# Patient Record
Sex: Male | Born: 1945 | Race: White | Hispanic: No | Marital: Married | Smoking: Never smoker
Health system: Southern US, Community
[De-identification: ages and names within clinical notes are randomized; demographics above are authoritative.]

## PROBLEM LIST (undated history)

## (undated) DIAGNOSIS — G4730 Sleep apnea, unspecified: Secondary | ICD-10-CM

## (undated) DIAGNOSIS — N401 Enlarged prostate with lower urinary tract symptoms: Secondary | ICD-10-CM

## (undated) DIAGNOSIS — N138 Other obstructive and reflux uropathy: Secondary | ICD-10-CM

## (undated) DIAGNOSIS — T300 Burn of unspecified body region, unspecified degree: Secondary | ICD-10-CM

## (undated) DIAGNOSIS — F4310 Post-traumatic stress disorder, unspecified: Secondary | ICD-10-CM

## (undated) DIAGNOSIS — Z87442 Personal history of urinary calculi: Secondary | ICD-10-CM

## (undated) DIAGNOSIS — I10 Essential (primary) hypertension: Secondary | ICD-10-CM

## (undated) DIAGNOSIS — J45909 Unspecified asthma, uncomplicated: Secondary | ICD-10-CM

## (undated) DIAGNOSIS — F329 Major depressive disorder, single episode, unspecified: Secondary | ICD-10-CM

## (undated) DIAGNOSIS — K219 Gastro-esophageal reflux disease without esophagitis: Secondary | ICD-10-CM

## (undated) DIAGNOSIS — L639 Alopecia areata, unspecified: Secondary | ICD-10-CM

## (undated) DIAGNOSIS — K5790 Diverticulosis of intestine, part unspecified, without perforation or abscess without bleeding: Secondary | ICD-10-CM

## (undated) DIAGNOSIS — M1990 Unspecified osteoarthritis, unspecified site: Secondary | ICD-10-CM

## (undated) HISTORY — PX: COLONOSCOPY: SHX174

## (undated) HISTORY — PX: JOINT REPLACEMENT: SHX530

---

## 1970-09-09 DIAGNOSIS — T300 Burn of unspecified body region, unspecified degree: Secondary | ICD-10-CM

## 1970-09-09 HISTORY — DX: Burn of unspecified body region, unspecified degree: T30.0

## 2008-02-01 ENCOUNTER — Ambulatory Visit: Payer: Self-pay | Admitting: Family Medicine

## 2014-12-09 HISTORY — PX: TOTAL SHOULDER REPLACEMENT: SUR1217

## 2017-12-03 ENCOUNTER — Other Ambulatory Visit: Payer: Self-pay | Admitting: Surgery

## 2017-12-03 DIAGNOSIS — M7581 Other shoulder lesions, right shoulder: Secondary | ICD-10-CM

## 2017-12-05 ENCOUNTER — Other Ambulatory Visit: Payer: Self-pay | Admitting: Surgery

## 2017-12-05 DIAGNOSIS — M7581 Other shoulder lesions, right shoulder: Secondary | ICD-10-CM

## 2017-12-19 ENCOUNTER — Ambulatory Visit
Admission: RE | Admit: 2017-12-19 | Discharge: 2017-12-19 | Disposition: A | Discharge status: Home / Self Care | Payer: No Typology Code available for payment source | Source: Ambulatory Visit | Attending: Surgery | Admitting: Surgery

## 2017-12-19 DIAGNOSIS — M7581 Other shoulder lesions, right shoulder: Secondary | ICD-10-CM | POA: Diagnosis present

## 2017-12-19 DIAGNOSIS — M19011 Primary osteoarthritis, right shoulder: Secondary | ICD-10-CM | POA: Diagnosis not present

## 2017-12-19 DIAGNOSIS — M24011 Loose body in right shoulder: Secondary | ICD-10-CM | POA: Diagnosis not present

## 2018-01-12 ENCOUNTER — Other Ambulatory Visit: Payer: Self-pay

## 2018-01-12 ENCOUNTER — Encounter
Admission: RE | Admit: 2018-01-12 | Discharge: 2018-01-12 | Disposition: A | Discharge status: Home / Self Care | Payer: Non-veteran care | Source: Ambulatory Visit | Attending: Surgery | Admitting: Surgery

## 2018-01-12 DIAGNOSIS — Z01812 Encounter for preprocedural laboratory examination: Secondary | ICD-10-CM | POA: Diagnosis present

## 2018-01-12 DIAGNOSIS — R001 Bradycardia, unspecified: Secondary | ICD-10-CM | POA: Insufficient documentation

## 2018-01-12 DIAGNOSIS — Z01810 Encounter for preprocedural cardiovascular examination: Secondary | ICD-10-CM | POA: Diagnosis present

## 2018-01-12 HISTORY — DX: Unspecified osteoarthritis, unspecified site: M19.90

## 2018-01-12 HISTORY — DX: Diverticulosis of intestine, part unspecified, without perforation or abscess without bleeding: K57.90

## 2018-01-12 HISTORY — DX: Benign prostatic hyperplasia with lower urinary tract symptoms: N40.1

## 2018-01-12 HISTORY — DX: Other obstructive and reflux uropathy: N13.8

## 2018-01-12 HISTORY — DX: Essential (primary) hypertension: I10

## 2018-01-12 HISTORY — DX: Sleep apnea, unspecified: G47.30

## 2018-01-12 HISTORY — DX: Unspecified asthma, uncomplicated: J45.909

## 2018-01-12 HISTORY — DX: Alopecia areata, unspecified: L63.9

## 2018-01-12 HISTORY — DX: Personal history of urinary calculi: Z87.442

## 2018-01-12 HISTORY — DX: Major depressive disorder, single episode, unspecified: F32.9

## 2018-01-12 HISTORY — DX: Burn of unspecified body region, unspecified degree: T30.0

## 2018-01-12 HISTORY — DX: Gastro-esophageal reflux disease without esophagitis: K21.9

## 2018-01-12 HISTORY — DX: Post-traumatic stress disorder, unspecified: F43.10

## 2018-01-12 LAB — BASIC METABOLIC PANEL
Anion gap: 6 (ref 5–15)
BUN: 21 mg/dL — AB (ref 6–20)
CHLORIDE: 104 mmol/L (ref 101–111)
CO2: 29 mmol/L (ref 22–32)
Calcium: 9.1 mg/dL (ref 8.9–10.3)
Creatinine, Ser: 1.05 mg/dL (ref 0.61–1.24)
GFR calc non Af Amer: >60 mL/min (ref >60)
Glucose, Bld: 92 mg/dL (ref 65–99)
POTASSIUM: 4.2 mmol/L (ref 3.5–5.1)
SODIUM: 139 mmol/L (ref 135–145)

## 2018-01-12 LAB — CBC
HEMATOCRIT: 45.3 % (ref 40.0–52.0)
HEMOGLOBIN: 15.9 g/dL (ref 13.0–18.0)
MCH: 30.7 pg (ref 26.0–34.0)
MCHC: 35.2 g/dL (ref 32.0–36.0)
MCV: 87.1 fL (ref 80.0–100.0)
Platelets: 156 10*3/uL (ref 150–440)
RBC: 5.2 MIL/uL (ref 4.40–5.90)
RDW: 12.9 % (ref 11.5–14.5)
WBC: 7.5 10*3/uL (ref 3.8–10.6)

## 2018-01-12 LAB — SURGICAL PCR SCREEN
MRSA, PCR: NEGATIVE
STAPHYLOCOCCUS AUREUS: POSITIVE — AB

## 2018-01-12 LAB — PROTIME-INR
INR: 1.03
PROTHROMBIN TIME: 13.4 s (ref 11.4–15.2)

## 2018-01-12 LAB — URINALYSIS, ROUTINE W REFLEX MICROSCOPIC
Bilirubin Urine: NEGATIVE
Glucose, UA: NEGATIVE mg/dL
Hgb urine dipstick: NEGATIVE
KETONES UR: NEGATIVE mg/dL
LEUKOCYTES UA: NEGATIVE
NITRITE: NEGATIVE
PROTEIN: NEGATIVE mg/dL
Specific Gravity, Urine: 1.009 (ref 1.005–1.030)
pH: 6 (ref 5.0–8.0)

## 2018-01-12 LAB — TYPE AND SCREEN
ABO/RH(D): A NEG
Antibody Screen: NEGATIVE

## 2018-01-12 NOTE — Patient Instructions (Signed)
Your procedure is scheduled on: Tuesday, Jan 20, 2018 Report to Day Surgery on the 2nd floor of the [facility] To find out your arrival time, please call [phone] between 1PM - 3PM on: Monday, Jan 19, 2018  REMEMBER: Instructions that are not followed completely may result in serious medical risk, up to and including death; or upon the discretion of your surgeon and anesthesiologist your surgery may need to be rescheduled.  Do not eat food after midnight the night before your procedure.  No gum chewing, lozengers or hard candies.  You may however, drink CLEAR liquids up to 2 hours before you are scheduled to arrive for your surgery. Do not drink anything within 2 hours of the start of your surgery.  Clear liquids include: - water  - apple juice without pulp - clear gatorade - black coffee or tea (Do NOT add anything to the coffee or tea) Do NOT drink anything that is not on this list.  No Alcohol for 24 hours before or after surgery.  No Smoking including e-cigarettes for 24 hours prior to surgery.  No chewable tobacco products for at least 6 hours prior to surgery.  No nicotine patches on the day of surgery.  On the morning of surgery brush your teeth with toothpaste and water, you may rinse your mouth with mouthwash if you wish. Do not swallow any toothpaste or mouthwash.  Notify your doctor if there is any change in your medical condition (cold, fever, infection).  Do not wear jewelry, make-up, hairpins, clips or nail polish.  Do not wear lotions, powders, or perfumes.   Do not shave 48 hours prior to surgery. Men may shave face and neck.  Contacts and dentures may not be worn into surgery.  Do not bring valuables to the hospital, including drivers license, insurance or credit cards.  [facility] is not responsible for any belongings or valuables.   TAKE THESE MEDICATIONS THE MORNING OF SURGERY:  1.  ALBUTEROL INHALER 2.  AMLODIPINE 3.  BUPROPION 4.   CARVEDILOL 5.  PANTOPRAZOLE 6.  SERTRALINE 7.  OXYCODONE (if needed for pain)  Use CHG Soap as directed on instruction sheet.  Use inhalers on the day of surgery and bring to the hospital.  NOW!  Stop ASPIRIN and Anti-inflammatories (NSAIDS) such as NABUMETONE, Advil, Aleve, Ibuprofen, Motrin, Naproxen, Naprosyn and Aspirin based products such as Excedrin, Goodys Powder, BC Powder. (May take Tylenol or Acetaminophen if needed.)  NOW!  Stop ANY OVER THE COUNTER supplements until after surgery.  Wear comfortable clothing (specific to your surgery type) to the hospital.  Plan for stool softeners for home use.  If you are being admitted to the hospital overnight, leave your suitcase in the car. After surgery it may be brought to your room.  If you are being discharged the day of surgery, you will not be allowed to drive home. You will need a responsible adult to drive you home and stay with you that night.   If you are taking public transportation, you will need to have a responsible adult with you. Please confirm with your physician that it is acceptable to use public transportation.   Please call [phone] if you have any questions about these instructions.

## 2018-01-13 LAB — URINE CULTURE: Culture: <10000 — AB

## 2018-01-19 MED ORDER — VANCOMYCIN HCL IN DEXTROSE 1-5 GM/200ML-% IV SOLN
1000.0000 mg | Freq: Once | INTRAVENOUS | Status: AC
  Administered 2018-01-20: 1000 mg via INTRAVENOUS

## 2018-01-20 ENCOUNTER — Inpatient Hospital Stay: Payer: Non-veteran care

## 2018-01-20 ENCOUNTER — Inpatient Hospital Stay
Admission: RE | Admit: 2018-01-20 | Discharge: 2018-01-21 | DRG: 483 | Disposition: A | Discharge status: Home Health | Payer: Non-veteran care | Source: Ambulatory Visit | Attending: Surgery | Admitting: Surgery

## 2018-01-20 ENCOUNTER — Inpatient Hospital Stay: Payer: Non-veteran care | Admitting: Anesthesiology

## 2018-01-20 ENCOUNTER — Encounter: Payer: Self-pay | Admitting: *Deleted

## 2018-01-20 ENCOUNTER — Other Ambulatory Visit: Payer: Self-pay

## 2018-01-20 ENCOUNTER — Encounter
Admission: RE | Disposition: A | Discharge status: Home Health | Payer: Self-pay | Source: Ambulatory Visit | Attending: Surgery

## 2018-01-20 DIAGNOSIS — F4310 Post-traumatic stress disorder, unspecified: Secondary | ICD-10-CM | POA: Diagnosis present

## 2018-01-20 DIAGNOSIS — J45909 Unspecified asthma, uncomplicated: Secondary | ICD-10-CM | POA: Diagnosis present

## 2018-01-20 DIAGNOSIS — G4730 Sleep apnea, unspecified: Secondary | ICD-10-CM | POA: Diagnosis present

## 2018-01-20 DIAGNOSIS — K219 Gastro-esophageal reflux disease without esophagitis: Secondary | ICD-10-CM | POA: Diagnosis present

## 2018-01-20 DIAGNOSIS — I10 Essential (primary) hypertension: Secondary | ICD-10-CM | POA: Diagnosis present

## 2018-01-20 DIAGNOSIS — E669 Obesity, unspecified: Secondary | ICD-10-CM | POA: Diagnosis present

## 2018-01-20 DIAGNOSIS — Z6830 Body mass index (BMI) 30.0-30.9, adult: Secondary | ICD-10-CM

## 2018-01-20 DIAGNOSIS — Z96611 Presence of right artificial shoulder joint: Secondary | ICD-10-CM

## 2018-01-20 DIAGNOSIS — M19011 Primary osteoarthritis, right shoulder: Secondary | ICD-10-CM | POA: Diagnosis present

## 2018-01-20 DIAGNOSIS — Z7982 Long term (current) use of aspirin: Secondary | ICD-10-CM | POA: Diagnosis not present

## 2018-01-20 HISTORY — PX: REVERSE SHOULDER ARTHROPLASTY: SHX5054

## 2018-01-20 LAB — ABO/RH: ABO/RH(D): A NEG

## 2018-01-20 SURGERY — ARTHROPLASTY, SHOULDER, TOTAL, REVERSE
Anesthesia: General | Site: Shoulder | Laterality: Right | Wound class: Clean

## 2018-01-20 MED ORDER — HYDROMORPHONE HCL 1 MG/ML IJ SOLN: 0.5000 mg | INTRAMUSCULAR | Status: DC | PRN

## 2018-01-20 MED ORDER — TRANEXAMIC ACID 1000 MG/10ML IV SOLN
INTRAVENOUS | Status: AC
  Filled 2018-01-20: qty 10

## 2018-01-20 MED ORDER — BUPIVACAINE HCL (PF) 0.5 % IJ SOLN
INTRAMUSCULAR | Status: DC | PRN
  Administered 2018-01-20: 10 mL via PERINEURAL

## 2018-01-20 MED ORDER — KETOROLAC TROMETHAMINE 15 MG/ML IJ SOLN: 15.0000 mg | Freq: Once | INTRAMUSCULAR | Status: DC

## 2018-01-20 MED ORDER — OXYCODONE HCL 5 MG/5ML PO SOLN: 5.0000 mg | Freq: Once | ORAL | Status: DC | PRN

## 2018-01-20 MED ORDER — PRAZOSIN HCL 2 MG PO CAPS
2.0000 mg | ORAL_CAPSULE | Freq: Every day | ORAL | Status: DC
  Administered 2018-01-20: 2 mg via ORAL
  Filled 2018-01-20 (×2): qty 1

## 2018-01-20 MED ORDER — GLYCOPYRROLATE 0.2 MG/ML IJ SOLN
INTRAMUSCULAR | Status: DC | PRN
  Administered 2018-01-20: 0.2 mg via INTRAVENOUS

## 2018-01-20 MED ORDER — NEOMYCIN-POLYMYXIN B GU 40-200000 IR SOLN
Status: DC | PRN
  Administered 2018-01-20: 14 mL

## 2018-01-20 MED ORDER — VANCOMYCIN HCL IN DEXTROSE 1-5 GM/200ML-% IV SOLN
1000.0000 mg | Freq: Two times a day (BID) | INTRAVENOUS | Status: DC
  Filled 2018-01-20: qty 200

## 2018-01-20 MED ORDER — MEPERIDINE HCL 50 MG/ML IJ SOLN: 6.2500 mg | INTRAMUSCULAR | Status: DC | PRN

## 2018-01-20 MED ORDER — LIDOCAINE HCL (PF) 1 % IJ SOLN
INTRAMUSCULAR | Status: DC | PRN
  Administered 2018-01-20: 3 mL

## 2018-01-20 MED ORDER — BUPIVACAINE-EPINEPHRINE (PF) 0.5% -1:200000 IJ SOLN
INTRAMUSCULAR | Status: AC
  Filled 2018-01-20: qty 30

## 2018-01-20 MED ORDER — ONDANSETRON HCL 4 MG PO TABS: 4.0000 mg | ORAL_TABLET | Freq: Four times a day (QID) | ORAL | Status: DC | PRN

## 2018-01-20 MED ORDER — KETOROLAC TROMETHAMINE 15 MG/ML IJ SOLN
7.5000 mg | Freq: Four times a day (QID) | INTRAMUSCULAR | Status: AC
  Administered 2018-01-21 (×2): 7.5 mg via INTRAVENOUS
  Filled 2018-01-20 (×2): qty 1

## 2018-01-20 MED ORDER — TRAZODONE HCL 100 MG PO TABS
150.0000 mg | ORAL_TABLET | Freq: Every day | ORAL | Status: DC
  Administered 2018-01-20: 150 mg via ORAL
  Filled 2018-01-20: qty 1

## 2018-01-20 MED ORDER — BUPIVACAINE HCL (PF) 0.5 % IJ SOLN
INTRAMUSCULAR | Status: AC
  Filled 2018-01-20: qty 10

## 2018-01-20 MED ORDER — NEOMYCIN-POLYMYXIN B GU 40-200000 IR SOLN
Status: AC
  Filled 2018-01-20: qty 20

## 2018-01-20 MED ORDER — DEXAMETHASONE SODIUM PHOSPHATE 10 MG/ML IJ SOLN
INTRAMUSCULAR | Status: DC | PRN
  Administered 2018-01-20: 5 mg via INTRAVENOUS

## 2018-01-20 MED ORDER — SODIUM CHLORIDE FLUSH 0.9 % IV SOLN
INTRAVENOUS | Status: AC
  Filled 2018-01-20: qty 50

## 2018-01-20 MED ORDER — FLEET ENEMA 7-19 GM/118ML RE ENEM: 1.0000 | ENEMA | Freq: Once | RECTAL | Status: DC | PRN

## 2018-01-20 MED ORDER — BISACODYL 10 MG RE SUPP: 10.0000 mg | Freq: Every day | RECTAL | Status: DC | PRN

## 2018-01-20 MED ORDER — PROMETHAZINE HCL 25 MG/ML IJ SOLN: 6.2500 mg | INTRAMUSCULAR | Status: DC | PRN

## 2018-01-20 MED ORDER — BUPIVACAINE LIPOSOME 1.3 % IJ SUSP
INTRAMUSCULAR | Status: DC | PRN
  Administered 2018-01-20: 20 mL via PERINEURAL

## 2018-01-20 MED ORDER — OXYCODONE HCL 5 MG PO TABS
5.0000 mg | ORAL_TABLET | ORAL | Status: DC | PRN
  Administered 2018-01-20 – 2018-01-21 (×4): 10 mg via ORAL
  Filled 2018-01-20 (×4): qty 2

## 2018-01-20 MED ORDER — ASPIRIN EC 81 MG PO TBEC
81.0000 mg | DELAYED_RELEASE_TABLET | Freq: Every day | ORAL | Status: DC
  Administered 2018-01-21: 81 mg via ORAL
  Filled 2018-01-20: qty 1

## 2018-01-20 MED ORDER — LIDOCAINE HCL (PF) 1 % IJ SOLN
INTRAMUSCULAR | Status: AC
  Filled 2018-01-20: qty 5

## 2018-01-20 MED ORDER — DOCUSATE SODIUM 100 MG PO CAPS
100.0000 mg | ORAL_CAPSULE | Freq: Two times a day (BID) | ORAL | Status: DC
  Administered 2018-01-20 – 2018-01-21 (×2): 100 mg via ORAL
  Filled 2018-01-20 (×2): qty 1

## 2018-01-20 MED ORDER — SODIUM CHLORIDE 0.9 % IV SOLN
INTRAVENOUS | Status: DC
  Administered 2018-01-20: 22:00:00 via INTRAVENOUS

## 2018-01-20 MED ORDER — NITROGLYCERIN 0.4 MG SL SUBL: 0.4000 mg | SUBLINGUAL_TABLET | SUBLINGUAL | Status: DC | PRN

## 2018-01-20 MED ORDER — FENTANYL CITRATE (PF) 100 MCG/2ML IJ SOLN
INTRAMUSCULAR | Status: DC | PRN
  Administered 2018-01-20: 25 ug via INTRAVENOUS
  Administered 2018-01-20: 50 ug via INTRAVENOUS
  Administered 2018-01-20 (×2): 25 ug via INTRAVENOUS
  Administered 2018-01-20: 50 ug via INTRAVENOUS

## 2018-01-20 MED ORDER — PROPOFOL 10 MG/ML IV BOLUS
INTRAVENOUS | Status: DC | PRN
  Administered 2018-01-20: 200 mg via INTRAVENOUS

## 2018-01-20 MED ORDER — BUPIVACAINE-EPINEPHRINE (PF) 0.5% -1:200000 IJ SOLN
INTRAMUSCULAR | Status: DC | PRN
  Administered 2018-01-20: 30 mL via PERINEURAL

## 2018-01-20 MED ORDER — BUPIVACAINE LIPOSOME 1.3 % IJ SUSP
INTRAMUSCULAR | Status: AC
  Filled 2018-01-20: qty 20

## 2018-01-20 MED ORDER — ONDANSETRON HCL 4 MG/2ML IJ SOLN
INTRAMUSCULAR | Status: AC
  Filled 2018-01-20: qty 2

## 2018-01-20 MED ORDER — CARVEDILOL 3.125 MG PO TABS
6.2500 mg | ORAL_TABLET | Freq: Two times a day (BID) | ORAL | Status: DC
  Administered 2018-01-21: 6.25 mg via ORAL
  Filled 2018-01-20: qty 2

## 2018-01-20 MED ORDER — MIDAZOLAM HCL 2 MG/2ML IJ SOLN
INTRAMUSCULAR | Status: AC
  Filled 2018-01-20: qty 2

## 2018-01-20 MED ORDER — CROMOLYN SODIUM 4 % OP SOLN
2.0000 [drp] | Freq: Three times a day (TID) | OPHTHALMIC | Status: DC
  Administered 2018-01-20: 2 [drp] via OPHTHALMIC
  Filled 2018-01-20: qty 10

## 2018-01-20 MED ORDER — MAGNESIUM HYDROXIDE 400 MG/5ML PO SUSP: 30.0000 mL | Freq: Every day | ORAL | Status: DC | PRN

## 2018-01-20 MED ORDER — FENTANYL CITRATE (PF) 100 MCG/2ML IJ SOLN: 25.0000 ug | INTRAMUSCULAR | Status: DC | PRN

## 2018-01-20 MED ORDER — ROCURONIUM BROMIDE 50 MG/5ML IV SOLN
INTRAVENOUS | Status: AC
  Filled 2018-01-20: qty 2

## 2018-01-20 MED ORDER — CYCLOBENZAPRINE HCL 10 MG PO TABS
10.0000 mg | ORAL_TABLET | Freq: Three times a day (TID) | ORAL | Status: DC | PRN
  Administered 2018-01-20: 10 mg via ORAL
  Filled 2018-01-20: qty 1

## 2018-01-20 MED ORDER — MIDAZOLAM HCL 2 MG/2ML IJ SOLN
1.0000 mg | Freq: Once | INTRAMUSCULAR | Status: AC
  Administered 2018-01-20: 1 mg via INTRAVENOUS

## 2018-01-20 MED ORDER — AMLODIPINE BESYLATE 10 MG PO TABS
10.0000 mg | ORAL_TABLET | Freq: Every day | ORAL | Status: DC
  Administered 2018-01-21: 10 mg via ORAL
  Filled 2018-01-20: qty 1

## 2018-01-20 MED ORDER — LACTATED RINGERS IV SOLN
INTRAVENOUS | Status: DC
  Administered 2018-01-20 (×2): via INTRAVENOUS

## 2018-01-20 MED ORDER — ACETAMINOPHEN 500 MG PO TABS
1000.0000 mg | ORAL_TABLET | Freq: Four times a day (QID) | ORAL | Status: AC
  Administered 2018-01-21 (×2): 1000 mg via ORAL
  Filled 2018-01-20 (×2): qty 2

## 2018-01-20 MED ORDER — PROPOFOL 10 MG/ML IV BOLUS
INTRAVENOUS | Status: AC
  Filled 2018-01-20: qty 20

## 2018-01-20 MED ORDER — FENTANYL CITRATE (PF) 100 MCG/2ML IJ SOLN: 50.0000 ug | Freq: Once | INTRAMUSCULAR | Status: DC

## 2018-01-20 MED ORDER — LIDOCAINE HCL (PF) 2 % IJ SOLN
INTRAMUSCULAR | Status: AC
  Filled 2018-01-20: qty 10

## 2018-01-20 MED ORDER — METOCLOPRAMIDE HCL 5 MG/ML IJ SOLN: 5.0000 mg | Freq: Three times a day (TID) | INTRAMUSCULAR | Status: DC | PRN

## 2018-01-20 MED ORDER — HYDROCHLOROTHIAZIDE 25 MG PO TABS
12.5000 mg | ORAL_TABLET | Freq: Every day | ORAL | Status: DC
  Administered 2018-01-21: 12.5 mg via ORAL
  Filled 2018-01-20: qty 1

## 2018-01-20 MED ORDER — TRAMADOL HCL 50 MG PO TABS
50.0000 mg | ORAL_TABLET | Freq: Four times a day (QID) | ORAL | Status: DC | PRN
  Administered 2018-01-20: 50 mg via ORAL
  Filled 2018-01-20: qty 1

## 2018-01-20 MED ORDER — VANCOMYCIN HCL IN DEXTROSE 1-5 GM/200ML-% IV SOLN
INTRAVENOUS | Status: AC
  Administered 2018-01-20: 1000 mg via INTRAVENOUS
  Filled 2018-01-20: qty 200

## 2018-01-20 MED ORDER — FENTANYL CITRATE (PF) 250 MCG/5ML IJ SOLN
INTRAMUSCULAR | Status: AC
Start: 2018-01-20 — End: ?
  Filled 2018-01-20: qty 5

## 2018-01-20 MED ORDER — METOCLOPRAMIDE HCL 10 MG PO TABS: 5.0000 mg | ORAL_TABLET | Freq: Three times a day (TID) | ORAL | Status: DC | PRN

## 2018-01-20 MED ORDER — PANTOPRAZOLE SODIUM 40 MG PO TBEC
40.0000 mg | DELAYED_RELEASE_TABLET | Freq: Two times a day (BID) | ORAL | Status: DC
  Administered 2018-01-21: 40 mg via ORAL
  Filled 2018-01-20: qty 1

## 2018-01-20 MED ORDER — DIPHENHYDRAMINE HCL 12.5 MG/5ML PO ELIX
12.5000 mg | ORAL_SOLUTION | ORAL | Status: DC | PRN
  Administered 2018-01-21: 25 mg via ORAL
  Filled 2018-01-20: qty 10

## 2018-01-20 MED ORDER — TRANEXAMIC ACID 1000 MG/10ML IV SOLN
INTRAVENOUS | Status: DC | PRN
  Administered 2018-01-20: 1000 mg via INTRAVENOUS

## 2018-01-20 MED ORDER — OXYCODONE HCL 5 MG PO TABS: 5.0000 mg | ORAL_TABLET | Freq: Once | ORAL | Status: DC | PRN

## 2018-01-20 MED ORDER — ACETAMINOPHEN 325 MG PO TABS: 325.0000 mg | ORAL_TABLET | Freq: Four times a day (QID) | ORAL | Status: DC | PRN

## 2018-01-20 MED ORDER — EPHEDRINE SULFATE 50 MG/ML IJ SOLN
INTRAMUSCULAR | Status: DC | PRN
  Administered 2018-01-20: 10 mg via INTRAVENOUS

## 2018-01-20 MED ORDER — ENOXAPARIN SODIUM 40 MG/0.4ML ~~LOC~~ SOLN: 40.0000 mg | SUBCUTANEOUS | Status: DC

## 2018-01-20 MED ORDER — FENTANYL CITRATE (PF) 100 MCG/2ML IJ SOLN
INTRAMUSCULAR | Status: AC
  Administered 2018-01-20: 50 ug
  Filled 2018-01-20: qty 2

## 2018-01-20 MED ORDER — SUGAMMADEX SODIUM 200 MG/2ML IV SOLN
INTRAVENOUS | Status: AC
  Filled 2018-01-20: qty 2

## 2018-01-20 MED ORDER — ROCURONIUM BROMIDE 100 MG/10ML IV SOLN
INTRAVENOUS | Status: DC | PRN
Start: 2018-01-20 — End: 2018-01-20
  Administered 2018-01-20: 80 mg via INTRAVENOUS
  Administered 2018-01-20: 20 mg via INTRAVENOUS

## 2018-01-20 MED ORDER — ONDANSETRON HCL 4 MG/2ML IJ SOLN: 4.0000 mg | Freq: Four times a day (QID) | INTRAMUSCULAR | Status: DC | PRN

## 2018-01-20 MED ORDER — ONDANSETRON HCL 4 MG/2ML IJ SOLN
INTRAMUSCULAR | Status: DC | PRN
  Administered 2018-01-20: 4 mg via INTRAVENOUS

## 2018-01-20 MED ORDER — ALBUTEROL SULFATE (2.5 MG/3ML) 0.083% IN NEBU: 3.0000 mL | INHALATION_SOLUTION | RESPIRATORY_TRACT | Status: DC | PRN

## 2018-01-20 MED ORDER — SERTRALINE HCL 50 MG PO TABS
200.0000 mg | ORAL_TABLET | Freq: Every day | ORAL | Status: DC
  Administered 2018-01-21: 200 mg via ORAL
  Filled 2018-01-20: qty 4

## 2018-01-20 MED ORDER — VANCOMYCIN HCL IN DEXTROSE 1-5 GM/200ML-% IV SOLN
1000.0000 mg | Freq: Two times a day (BID) | INTRAVENOUS | Status: AC
  Administered 2018-01-20: 1000 mg via INTRAVENOUS
  Filled 2018-01-20: qty 200

## 2018-01-20 MED ORDER — BUPROPION HCL 100 MG PO TABS
100.0000 mg | ORAL_TABLET | Freq: Every day | ORAL | Status: DC
  Administered 2018-01-21: 100 mg via ORAL
  Filled 2018-01-20: qty 1

## 2018-01-20 MED ORDER — LIDOCAINE HCL (CARDIAC) PF 100 MG/5ML IV SOSY
PREFILLED_SYRINGE | INTRAVENOUS | Status: DC | PRN
  Administered 2018-01-20: 100 mg via INTRAVENOUS

## 2018-01-20 MED ORDER — SUGAMMADEX SODIUM 200 MG/2ML IV SOLN
INTRAVENOUS | Status: DC | PRN
  Administered 2018-01-20: 200 mg via INTRAVENOUS

## 2018-01-20 MED ORDER — ROPIVACAINE HCL 5 MG/ML IJ SOLN
INTRAMUSCULAR | Status: AC
  Filled 2018-01-20: qty 30

## 2018-01-20 SURGICAL SUPPLY — 76 items
Augmented reamer guide bushing ×2 IMPLANT
Augmented reamer guide screw ×3 IMPLANT
BEARING HUMERAL E1 44-41 (Miscellaneous) ×2 IMPLANT
BEARING HUMERAL E1 44-41 +3 (Miscellaneous) ×1 IMPLANT
BEARING HUMERAL E1 44-41MM (Miscellaneous) ×1 IMPLANT
BIT DRILL 2.7 W/STOP DISP (BIT) ×2 IMPLANT
BIT DRILL TWIST 2.7 (BIT) ×2 IMPLANT
BIT DRILL TWIST 2.7MM (BIT) ×1
BLADE SAGITTAL WIDE XTHICK NO (BLADE) ×3 IMPLANT
CANISTER SUCT 1200ML W/VALVE (MISCELLANEOUS) ×3 IMPLANT
CANISTER SUCT 3000ML PPV (MISCELLANEOUS) ×6 IMPLANT
CHLORAPREP W/TINT 26ML (MISCELLANEOUS) ×3 IMPLANT
COMP REV AUG LG W/TAPER/GLENOI (Joint) ×3 IMPLANT
COMPONENT RV AUG LG W/TAPR/GLN (Joint) ×1 IMPLANT
COOLER POLAR GLACIER W/PUMP (MISCELLANEOUS) ×3 IMPLANT
CRADLE LAMINECT ARM (MISCELLANEOUS) ×3 IMPLANT
DRAPE IMP U-DRAPE 54X76 (DRAPES) ×6 IMPLANT
DRAPE INCISE IOBAN 66X45 STRL (DRAPES) ×6 IMPLANT
DRAPE INCISE IOBAN 66X60 STRL (DRAPES) ×3 IMPLANT
DRAPE SHEET LG 3/4 BI-LAMINATE (DRAPES) ×6 IMPLANT
DRAPE TABLE BACK 80X90 (DRAPES) ×3 IMPLANT
DRSG OPSITE POSTOP 4X8 (GAUZE/BANDAGES/DRESSINGS) ×3 IMPLANT
ELECT BLADE 6.5 EXT (BLADE) ×3 IMPLANT
ELECT CAUTERY BLADE 6.4 (BLADE) ×3 IMPLANT
GLENOID SPHERE MED 3INX5IN (Orthopedic Implant) ×3 IMPLANT
GLOVE BIO SURGEON STRL SZ7.5 (GLOVE) ×12 IMPLANT
GLOVE BIO SURGEON STRL SZ8 (GLOVE) ×12 IMPLANT
GLOVE BIOGEL PI IND STRL 8 (GLOVE) ×1 IMPLANT
GLOVE BIOGEL PI INDICATOR 8 (GLOVE) ×2
GLOVE INDICATOR 8.0 STRL GRN (GLOVE) ×3 IMPLANT
GOWN STRL REUS W/ TWL LRG LVL3 (GOWN DISPOSABLE) ×1 IMPLANT
GOWN STRL REUS W/ TWL XL LVL3 (GOWN DISPOSABLE) ×1 IMPLANT
GOWN STRL REUS W/TWL LRG LVL3 (GOWN DISPOSABLE) ×2
GOWN STRL REUS W/TWL XL LVL3 (GOWN DISPOSABLE) ×2
HOOD PEEL AWAY FLYTE STAYCOOL (MISCELLANEOUS) ×9 IMPLANT
KIT STABILIZATION SHOULDER (MISCELLANEOUS) ×3 IMPLANT
KIT TURNOVER KIT A (KITS) ×3 IMPLANT
MASK FACE SPIDER DISP (MASK) ×3 IMPLANT
NDL SAFETY ECLIPSE 18X1.5 (NEEDLE) ×1 IMPLANT
NEEDLE HYPO 18GX1.5 SHARP (NEEDLE) ×2
NEEDLE HYPO 22GX1.5 SAFETY (NEEDLE) ×3 IMPLANT
NEEDLE SPNL 20GX3.5 QUINCKE YW (NEEDLE) ×3 IMPLANT
NS IRRIG 500ML POUR BTL (IV SOLUTION) ×3 IMPLANT
PACK ARTHROSCOPY SHOULDER (MISCELLANEOUS) ×3 IMPLANT
PAD WRAPON POLAR SHDR UNIV (MISCELLANEOUS) ×1 IMPLANT
PIN THREADED REVERSE (PIN) ×3 IMPLANT
PULSAVAC PLUS IRRIG FAN TIP (DISPOSABLE) ×3
REAMER GUIDE BUSHING SURG DISP (MISCELLANEOUS) ×3 IMPLANT
REAMER GUIDE W/SCREW AUG (MISCELLANEOUS) ×3 IMPLANT
SCREW BONE LOCKING 4.75X35X3.5 (Screw) ×3 IMPLANT
SCREW BONE LOCKING 4.75X40X3.5 (Screw) ×3 IMPLANT
SCREW BONE STRL 6.5MMX30MM (Screw) ×3 IMPLANT
SCREW LOCKING 4.75MMX15MM (Screw) ×3 IMPLANT
SCREW LOCKING STRL 4.75X25X3.5 (Screw) ×3 IMPLANT
SLING ULTRA II M (MISCELLANEOUS) ×3 IMPLANT
SOL .9 NS 3000ML IRR  AL (IV SOLUTION) ×2
SOL .9 NS 3000ML IRR UROMATIC (IV SOLUTION) ×1 IMPLANT
SPONGE LAP 18X18 5 PK (GAUZE/BANDAGES/DRESSINGS) ×3 IMPLANT
STAPLER SKIN PROX 35W (STAPLE) ×3 IMPLANT
STEM SHOULDER 16MMX55MM LONG (Stem) ×3 IMPLANT
SUT ETHIBOND 0 MO6 C/R (SUTURE) ×3 IMPLANT
SUT FIBERWIRE #2 38 BLUE 1/2 (SUTURE) ×12
SUT VIC AB 0 CT1 36 (SUTURE) ×6 IMPLANT
SUT VIC AB 2-0 CT1 27 (SUTURE) ×4
SUT VIC AB 2-0 CT1 TAPERPNT 27 (SUTURE) ×2 IMPLANT
SUTURE FIBERWR #2 38 BLUE 1/2 (SUTURE) ×4 IMPLANT
SYR 10ML LL (SYRINGE) ×3 IMPLANT
SYR 30ML LL (SYRINGE) ×6 IMPLANT
TIP FAN IRRIG PULSAVAC PLUS (DISPOSABLE) ×1 IMPLANT
TRAY FOLEY MTR SLVR 16FR STAT (SET/KITS/TRAYS/PACK) ×3 IMPLANT
TRAY HUM STD 44MM (Orthopedic Implant) ×3 IMPLANT
WRAPON POLAR PAD SHDR UNIV (MISCELLANEOUS) ×3
augmented drill with stop ×3 IMPLANT
reverse shoulder augmented - drill with stop ×3 IMPLANT
reverse shoulder augmented reamer guide ×2 IMPLANT
reverse shoulder augmented reamer guide screw ×3 IMPLANT
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
## 2018-01-20 NOTE — Anesthesia Procedure Notes (Signed)
Anesthesia Regional Block: Interscalene brachial plexus block   Pre-Anesthetic Checklist: ,, timeout performed, Correct Patient, Correct Site, Correct Laterality, Correct Procedure, Correct Position, site marked, Risks and benefits discussed,  Surgical consent,  Pre-op evaluation,  At surgeon's request and post-op pain management  Laterality: Right  Prep: chloraprep       Needles:  Injection technique: Single-shot  Needle Type: Stimiplex     Needle Length: 9cm  Needle Gauge: 21     Additional Needles:   Procedures:,,,, ultrasound used (permanent image in chart),,,,  Narrative:  Start time: 01/20/2018 10:35 AM End time: 01/20/2018 10:41 AM Injection made incrementally with aspirations every 5 mL.  Performed by: Personally  Anesthesiologist: Alver Fisher, MD  Additional Notes: Functioning IV was confirmed and monitors were applied.  A Stimuplex needle was used. Sterile prep and drape,hand hygiene and sterile gloves were used.  Negative aspiration and negative test dose prior to incremental administration of local anesthetic. The patient tolerated the procedure well.

## 2018-01-20 NOTE — Anesthesia Post-op Follow-up Note (Signed)
Anesthesia QCDR form completed.        

## 2018-01-20 NOTE — Op Note (Signed)
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  Post-Op Diagnosis:   Same  Procedure:   Reverse right total shoulder arthroplasty.  Surgeon:   Maryagnes Amos, MD  Assistant:   Horris Latino, PA-C  Anesthesia:   General endotracheal with an interscalene block placed preoperatively by the anesthesiologist.  Findings:   As above.  Complications:   None  EBL: 300 cc  Fluids:   1400 cc crystalloid  UOP:   None  TT:   None  Drains:   None  Closure:   Staples  Implants:   All press-fit Biomet Comprehensive system with a #15 micro-humeral stem, a 44 mm humeral tray with a +3 mm insert, and a large augmented mini-base plate with a 41 mm glenosphere.  Brief Clinical Note:   The patient is a 72 year old male with a long history of progressively worsening right shoulder pain. His symptoms have progressed despite medications, activity modification, injections, etc. His history and examination are consistent with advanced degenerative joint disease which was confirmed by plain radiographs and CT scanning. An MRI scan demonstrated moderate to severe tendon or pathic changes without full-thickness rotator cuff tearing. The patient presents at this time for a reverse right total shoulder arthroplasty.  Procedure:   The patient underwent placement of an interscalene block by the anesthesiologist in the preoperative holding area before being brought into the operating room and lain in the supine position. The patient then underwent general endotracheal intubation and anesthesia before the patient was repositioned in the beach chair position using the beach chair positioner. The right shoulder and upper extremity were prepped with ChloraPrep solution before being draped sterilely. Preoperative antibiotics were administered. A standard anterior approach to the shoulder was made through an approximately  4-5 inch incision. The incision was carried down through the subcutaneous tissues to expose the deltopectoral fascia. The interval between the deltoid and pectoralis muscles was identified and this plane developed, retracting the cephalic vein laterally with the deltoid muscle. The conjoined tendon was identified. Its lateral margin was dissected and the Kolbel self-retraining retractor inserted. The "three sisters" were identified and cauterized. Bursal tissues were removed to improve visualization. The subscapularis tendon was released from its attachment to the lesser tuberosity 1 cm proximal to its insertion and several tagging sutures placed.  A large calcified loose body was found in the anterior inferior capsular region and removed before the inferior capsule was released with care after identifying and protecting the axillary nerve. The proximal humeral cut was made at approximately 30 of retroversion using the extra-medullary guide.   Attention was redirected to the glenoid. The labrum was debrided circumferentially before the center of the glenoid was marked with electrocautery. The guidewire was drilled into the glenoid neck using the appropriate guide. After verifying its position, it was overreamed with the mini-baseplate reamer to create a flat surface over the anterior 60%. Using the sizers, it was determined that the large most accurately reflected the size of the posterior defect. The probe drill was used to create the pedicle in the anterior aspect of the glenoid before the secondary reaming guide was positioned and secured using the appropriate screw before the bushing was placed over it. The secondary milling was performed. Unfortunately, no significant bone was removed. Therefore, the above steps were repeated after reaming a little deeper with the initial reamer to enable appropriate bone contact across the entire augmented  baseplate.  The permanent large augmented mini-baseplate was  impacted into place. It was stabilized with a 30 x 6.5 mm central screw and four peripheral screws. Four locking screws of appropriate length were placed superiorly, inferiorly, anteriorly, and posteriorly. The permanent 41 mm glenosphere set at the C+ offset position was then impacted into place and its Morse taper locking mechanism verified using manual distraction.  Attention was directed to the humeral side. The humeral canal was reamed sequentially beginning with the end-cutting reamer then progressing from a 4 mm reamer up to a 15 mm reamer. This provided excellent circumferential chatter. The canal was broached beginning with a #11 broach and progressing to a #15 broach. This was left in place and a trial reduction performed using the standard trial humeral platform. The arm demonstrated excellent range of motion as the hand could be brought across the chest to the opposite shoulder and brought to the top of the patient's head and to the patient's ear. The shoulder appeared stable throughout this range of motion. The joint was dislocated and the trial components removed. The permanent #15 micro-stem was impacted into place with care taken to maintain the appropriate version. A repeat trial reduction was performed using both the +0 and +3 mm trial sizes. It was felt that the +3 mm option provided the best stability and the best tension.  Therefore, the permanent 44 mm humeral platform with the +3 mm insert was put together on the back table and impacted into place. Again, the [facility] taper locking mechanism was verified using manual distraction. The shoulder was relocated using two finger pressure and again placed through a range of motion with the findings as described above.  The wound was copiously irrigated with bacitracin saline solution using the jet lavage system before a total of 20 cc of Exparel diluted out to 60 cc with normal saline and 30 cc of 0.5% Sensorcaine with epinephrine was injected into  the pericapsular and peri-incisional tissues to help with postoperative analgesia. The subscapularis tendon was reapproximated using #2 FiberWire interrupted sutures. The deltopectoral interval was closed using #0 Vicryl interrupted sutures before the subcutaneous tissues were closed using 2-0 Vicryl interrupted sutures. The skin was closed using staples. Prior to closing the skin, 1 g of transexemic acid in 10 cc of normal saline was injected intra-articularly to help with postoperative bleeding. A sterile occlusive dressing was applied to the wound before the arm was placed into a shoulder immobilizer with an abduction pillow. A Polar Care system also was applied to the shoulder. The patient was then transferred back to a hospital bed before being awakened, extubated, and returned to the recovery room in satisfactory condition after tolerating the procedure well.

## 2018-01-20 NOTE — Anesthesia Procedure Notes (Addendum)
Procedure Name: Intubation Date/Time: 01/20/2018 11:54 AM Performed by: Bernardo Heater, CRNA Pre-anesthesia Checklist: Patient identified, Emergency Drugs available, Suction available and Patient being monitored Patient Re-evaluated:Patient Re-evaluated prior to induction Oxygen Delivery Method: Circle system utilized Preoxygenation: Pre-oxygenation with 100% oxygen Induction Type: IV induction Laryngoscope Size: Mac and 3 Grade View: Grade III Tube type: Oral Tube size: 7.0 mm Number of attempts: 2 Airway Equipment and Method: Video-laryngoscopy and Stylet Placement Confirmation: positive ETCO2,  breath sounds checked- equal and bilateral and ETT inserted through vocal cords under direct vision Secured at: 23 cm Tube secured with: Tape Dental Injury: Teeth and Oropharynx as per pre-operative assessment  Difficulty Due To: Difficulty was unanticipated and Difficult Airway- due to anterior larynx Future Recommendations: Recommend- induction with short-acting agent, and alternative techniques readily available

## 2018-01-20 NOTE — Progress Notes (Signed)
Pt ambulated to bathroom with no complaints

## 2018-01-20 NOTE — Anesthesia Postprocedure Evaluation (Signed)
Anesthesia Post Note  Patient: Guido Sander  Procedure(s) Performed: REVERSE SHOULDER ARTHROPLASTY (Right Shoulder)  Patient location during evaluation: PACU Anesthesia Type: General Level of consciousness: awake and alert and oriented Pain management: pain level controlled Vital Signs Assessment: post-procedure vital signs reviewed and stable Respiratory status: spontaneous breathing, nonlabored ventilation and respiratory function stable Cardiovascular status: blood pressure returned to baseline and stable Postop Assessment: no signs of nausea or vomiting Anesthetic complications: no     Last Vitals:  Vitals:   01/20/18 1526 01/20/18 1541  BP: 126/67 123/68  Pulse: 78 78  Resp: 16 18  Temp:  (!) 36.4 C  SpO2: 93% 96%    Last Pain:  Vitals:   01/20/18 1541  TempSrc:   PainSc: 5                  Hunter Bachar

## 2018-01-20 NOTE — Transfer of Care (Signed)
Immediate Anesthesia Transfer of Care Note  Patient: Jason Hood  Procedure(s) Performed: REVERSE SHOULDER ARTHROPLASTY (Right Shoulder)  Patient Location: PACU  Anesthesia Type:General  Level of Consciousness: awake, alert  and oriented  Airway & Oxygen Therapy: Patient Spontanous Breathing  Post-op Assessment: Post -op Vital signs reviewed and stable  Post vital signs: stable  Last Vitals:  Vitals Value Taken Time  BP 110/66 01/20/2018  3:11 PM  Temp 36.1 C 01/20/2018  3:11 PM  Pulse 73 01/20/2018  3:12 PM  Resp 14 01/20/2018  3:12 PM  SpO2 92 % 01/20/2018  3:12 PM  Vitals shown include unvalidated device data.  Last Pain:  Vitals:   01/20/18 1125  TempSrc:   PainSc: 0-No pain         Complications: No apparent anesthesia complications

## 2018-01-20 NOTE — H&P (Signed)
Paper H&P to be scanned into permanent record. H&P reviewed and patient re-examined. No changes. 

## 2018-01-21 ENCOUNTER — Encounter: Payer: Self-pay | Admitting: Surgery

## 2018-01-21 LAB — CBC WITH DIFFERENTIAL/PLATELET
BASOS PCT: 0 %
Basophils Absolute: 0 10*3/uL (ref 0–0.1)
EOS ABS: 0 10*3/uL (ref 0–0.7)
Eosinophils Relative: 0 %
HCT: 39.4 % — ABNORMAL LOW (ref 40.0–52.0)
Hemoglobin: 13.8 g/dL (ref 13.0–18.0)
LYMPHS ABS: 0.9 10*3/uL — AB (ref 1.0–3.6)
Lymphocytes Relative: 6 %
MCH: 30.4 pg (ref 26.0–34.0)
MCHC: 34.9 g/dL (ref 32.0–36.0)
MCV: 87.1 fL (ref 80.0–100.0)
Monocytes Absolute: 1.3 10*3/uL — ABNORMAL HIGH (ref 0.2–1.0)
Monocytes Relative: 9 %
NEUTROS PCT: 85 %
Neutro Abs: 11.9 10*3/uL — ABNORMAL HIGH (ref 1.4–6.5)
Platelets: 141 10*3/uL — ABNORMAL LOW (ref 150–440)
RBC: 4.52 MIL/uL (ref 4.40–5.90)
RDW: 12.5 % (ref 11.5–14.5)
WBC: 14.1 10*3/uL — AB (ref 3.8–10.6)

## 2018-01-21 LAB — BASIC METABOLIC PANEL
Anion gap: 5 (ref 5–15)
BUN: 23 mg/dL — AB (ref 6–20)
CO2: 27 mmol/L (ref 22–32)
Calcium: 8.8 mg/dL — ABNORMAL LOW (ref 8.9–10.3)
Chloride: 104 mmol/L (ref 101–111)
Creatinine, Ser: 1.35 mg/dL — ABNORMAL HIGH (ref 0.61–1.24)
GFR, EST AFRICAN AMERICAN: 60 mL/min — AB (ref >60)
GFR, EST NON AFRICAN AMERICAN: 52 mL/min — AB (ref >60)
Glucose, Bld: 132 mg/dL — ABNORMAL HIGH (ref 65–99)
POTASSIUM: 4 mmol/L (ref 3.5–5.1)
SODIUM: 136 mmol/L (ref 135–145)

## 2018-01-21 MED ORDER — TRAMADOL HCL 50 MG PO TABS: 50.0000 mg | ORAL_TABLET | Freq: Four times a day (QID) | ORAL | 0 refills | Status: AC | PRN

## 2018-01-21 MED ORDER — OXYCODONE HCL 5 MG PO TABS: 5.0000 mg | ORAL_TABLET | ORAL | 0 refills | Status: AC | PRN

## 2018-01-21 NOTE — Progress Notes (Signed)
Clinical Social Worker (CSW) received SNF consult. PT is recommending "Follow surgeon's recommendation for DC plan and follow-up therapies." RN case manager aware of above. Please reconsult if future social work needs arise. CSW signing off.   Elleah Hemsley, LCSW [phone]  

## 2018-01-21 NOTE — Care Management Note (Addendum)
Case Management Note  Patient Details  Name: Jason Hood MRN: [id number] Date of Birth: 1946/05/15  Subjective/Objective:  POD # 1 right shoulder arthroplasty. Spoke with patient. Due to his insurance and his address, [facility] will accept his policy. Referral Called to [facility] for PT and OT. Patient agreeable to POC. He will need no DME. Will discharge home on ASA.                    Action/Plan: [facility] for HHPT. Verified address as [address] [place] Expected Discharge Date:  01/21/18               Expected Discharge Plan:  Home w Home Health Services  In-House Referral:     Discharge planning Services  CM Consult  Post Acute Care Choice:  Home Health Choice offered to:  Patient  DME Arranged:    DME Agency:  [facility]  HH Arranged:  PT, OT [facility] Agency:     Status of Service:  Completed, signed off  If discussed at Long Length of Stay Meetings, dates discussed:    Additional Comments:  Marily Memos, RN 01/21/2018, 9:50 AM

## 2018-01-21 NOTE — Evaluation (Signed)
Occupational Therapy Evaluation Patient Details Name: Jason Hood MRN: [id number] DOB: 09/17/1945 Today's Date: 01/21/2018    History of Present Illness 72yo male pt POD#1 s/p reverse R TSA. PMHx includes GERD, arthritis, HTN, major depressive disorder, PTSD, sleep apnea, and L TSA approx 3 years ago.   Clinical Impression   Patient was seen for an OT evaluation this date. Pt lives with his spouse in a 2 story home (bedroom on main floor) with 4 steps to enter and bilateral rails (cannot reach both at same time). Pt and spouse have indoor and outdoor cats, 2 horses, and pt has a barn with a wood working shop. Prior to surgery, pt was active and independent, enjoys playing golf, riding horses, and doing Animal nutritionist projects. 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  Pt's spouse plans to assist pt as needed once home. Of note pt has L TSA performed approx 3 years ago with no complications, per pt report. Pt has orders for RUE to be immobilized and will be NWBing per MD. Patient presents with impaired strength/ROM, pain, sensation, and proprioception to RUE with interscalene block not completely resolved yet. These impairments result in a decreased ability to perform self care tasks requiring min assist for UB/LB dressing and bathing and max assist for application of polar care, compression stockings, and sling/immobilizer. Pt instructed in polar care mgt, compression stockings mgt, sling/immobilizer mgt, ROM exercises for RUE (with instructions for NO shoulder exercises until full sensation has returned), RUE precautions, adaptive strategies for bathing/dressing/toileting/grooming, positioning and considerations for sleep, and home/routines modifications to maximize falls prevention, safety, and independence. Handout provided. OT adjusted sling/immobilizer and polar care to improve comfort, optimize positioning, and to maximize skin integrity/safety. Pt verbalized understanding of  all education/training provided. All education/training provided, >23 minutes aside from evaluation. No additional skilled acute OT needs. Recommend follow up therapy as arranged by the surgeon following discharge home.       Follow Up Recommendations  Follow surgeon's recommendation for DC plan and follow-up therapies    Equipment Recommendations  None recommended by OT    Recommendations for Other Services       Precautions / Restrictions Precautions Precautions: Shoulder Type of Shoulder Precautions: RUE NWBing Shoulder Interventions: Shoulder sling/immobilizer;Shoulder abduction pillow;Off for dressing/bathing/exercises;At all times Precaution Booklet Issued: Yes (comment) Restrictions Weight Bearing Restrictions: Yes RUE Weight Bearing: Non weight bearing      Mobility Bed Mobility Overal bed mobility: Modified Independent             General bed mobility comments: no assist required, cautious with RUE  Transfers Overall transfer level: Independent Equipment used: None                  Balance Overall balance assessment: No apparent balance deficits (not formally assessed)                                         ADL either performed or assessed with clinical judgement   ADL Overall ADL's : Needs assistance/impaired Eating/Feeding: Sitting;Modified independent Eating/Feeding Details (indicate cue type and reason): using L non-dom hand Grooming: Standing;Modified independent Grooming Details (indicate cue type and reason): using L non-dom hand; instructed in underarm grooming techniques Upper Body Bathing: Sitting;Minimal assistance;With caregiver independent assisting;Moderate assistance Upper Body Bathing Details (indicate cue type and reason): using L non-dom hand, instructed in underarm bathing techniques Lower  Body Bathing: Sit to/from stand;Minimal assistance;With caregiver independent assisting   Upper Body Dressing :  Sitting;Minimal assistance Upper Body Dressing Details (indicate cue type and reason): instructed in hemi techniques for UB dressing, clothing types, and pt able to return demo requiring min assist and verbal cues to relax R shoulder Lower Body Dressing: Sit to/from stand;Modified independent;With adaptive equipment Lower Body Dressing Details (indicate cue type and reason): instructed in AE for LB dressing and pt able to return demo technique to don underwear and shorts while seated and complet donning over hips in standing with no physical assist required Toilet Transfer: Ambulation;Regular Toilet;Supervision/safety Toilet Transfer Details (indicate cue type and reason): good safety awareness, no LOB Toileting- Clothing Manipulation and Hygiene: Modified independent Toileting - Clothing Manipulation Details (indicate cue type and reason): using L non-dom hand     Functional mobility during ADLs: Supervision/safety       Vision Baseline Vision/History: Wears glasses Wears Glasses: At all times Patient Visual Report: No change from baseline       Perception     Praxis      Pertinent Vitals/Pain Pain Assessment: 0-10 Pain Score: 4  Pain Location: incision site, N/T in R thumb Pain Descriptors / Indicators: Numbness;Tingling;Aching Pain Intervention(s): Limited activity within patient's tolerance;Monitored during session;RN gave pain meds during session;Repositioned;Ice applied     Hand Dominance Right   Extremity/Trunk Assessment Upper Extremity Assessment Upper Extremity Assessment: Overall WFL for tasks assessed;RUE deficits/detail(LUE WFL) RUE Deficits / Details: interscalane block limiting pt's ability to perform AROM elbow, ROM hand WFL; impaired sensation and proprioception 2:2 nerve block RUE: Unable to fully assess due to immobilization;Unable to fully assess due to pain RUE Sensation: decreased proprioception;decreased light touch RUE Coordination: decreased gross  motor   Lower Extremity Assessment Lower Extremity Assessment: Overall WFL for tasks assessed   Cervical / Trunk Assessment Cervical / Trunk Assessment: Normal   Communication Communication Communication: No difficulties   Cognition Arousal/Alertness: Awake/alert Behavior During Therapy: WFL for tasks assessed/performed Overall Cognitive Status: Within Functional Limits for tasks assessed                                     General Comments  sling/immobilizer ill-fitted upon OT arrival and skin at risk for injury 2:2 polar care directly on portion of upper back as the towel had shifted. Re-applied polar care and sling/immobilizer to maximize comfort, skin protection, and joint protection.    Exercises Other Exercises Other Exercises: Pt instructed in polar care mgt, compression stockings mgt, sling/immobilizer and polar care mgt/wear schedule/positioning, falls prevention, pet care strategies, and home/routines modifications while recovering Other Exercises: Pt instructed in elbow/hand/wrist ROM exercises to perform, PROM of the shoulder deferred 2:2 interscalene block intact   Shoulder Instructions      Home Living Family/patient expects to be discharged to:: Private residence Living Arrangements: Spouse/significant other Available Help at Discharge: Family;Available 24 hours/day Type of Home: House Home Access: Stairs to enter [employer] of Steps: 4 Entrance Stairs-Rails: Left;Right(cannot reach both) Home Layout: Two level;Able to live on main level with bedroom/bathroom     Bathroom Shower/Tub: Walk-in shower         Home Equipment: Shower seat - built in;Adaptive equipment;Hand held Careers information officer - 2 wheels;Cane - single point Adaptive Equipment: Reacher        Prior Functioning/Environment Level of Independence: Independent        Comments: Pt was independent  with mobility, ADL, IADL, retired (built houses, 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 No falls.        OT Problem List:        OT Treatment/Interventions:      OT Goals(Current goals can be found in the care plan section) Acute Rehab OT Goals Patient Stated Goal: go home and get back to playing golf and riding horses OT Goal Formulation: All assessment and education complete, DC therapy  OT Frequency:     Barriers to D/C:            Co-evaluation              AM-PAC PT "6 Clicks" Daily Activity     Outcome Measure Help from another person eating meals?: None Help from another person taking care of personal grooming?: None Help from another person toileting, which includes using toliet, bedpan, or urinal?: None Help from another person bathing (including washing, rinsing, drying)?: A Little Help from another person to put on and taking off regular upper body clothing?: A Little Help from another person to put on and taking off regular lower body clothing?: None 6 Click Score: 22   End of Session    Activity Tolerance: Patient tolerated treatment well Patient left: in chair;with call bell/phone within reach;with chair alarm set;Other (comment)(polar care and sling/immobilizer in place)  OT Visit Diagnosis: Other abnormalities of gait and mobility (R26.89)                Time: 1610-9604 OT Time Calculation (min): 30 min Charges:  OT General Charges $OT Visit: 1 Visit OT Evaluation $OT Eval Low Complexity: 1 Low OT Treatments $Self Care/Home Management : 23-37 mins  Richrd Prime, MPH, MS, OTR/L ascom [phone] 01/21/18, 10:16 AM
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
## 2018-01-21 NOTE — Discharge Instructions (Signed)
Interscalene Nerve Block with Exparel  1.  For your surgery you have received an Interscalene Nerve Block with Exparel. 2. Nerve Blocks affect many types of nerves, including nerves that control movement, pain and normal sensation.  You may experience feelings such as numbness, tingling, heaviness, weakness or the inability to move your arm or the feeling or sensation that your arm has "fallen asleep". 3. A nerve block with Exparel can last up to 5 days.  Usually the weakness wears off first.  The tingling and heaviness usually wear off next.  Finally you may start to notice pain.  Keep in mind that this may occur in any order.  Once a nerve block starts to wear off it is usually completely gone within 60 minutes. 4. ISNB may cause mild shortness of breath, a hoarse voice, blurry vision, unequal pupils, or drooping of the face on the same side as the nerve block.  These symptoms will usually resolve with the numbness.  Very rarely the procedure itself can cause mild seizures. 5. If needed, your surgeon will give you a prescription for pain medication.  It will take about 60 minutes for the oral pain medication to become fully effective.  So, it is recommended that you start taking this medication before the nerve block first begins to wear off, or when you first begin to feel discomfort. 6. Take your pain medication only as prescribed.  Pain medication can cause sedation and decrease your breathing if you take more than you need for the level of pain that you have. 7. Nausea is a common side effect of many pain medications.  You may want to eat something before taking your pain medicine to prevent nausea. 8. After an Interscalene nerve block, you cannot feel pain, pressure or extremes in temperature in the effected arm.  Because your arm is numb it is at an increased risk for injury.  To decrease the possibility of injury, please practice the following:  a. While you are awake change the position of  your arm frequently to prevent too much pressure on any one area for prolonged periods of time. b.  If you have a cast or tight dressing, check the color or your fingers every couple of hours.  Call your surgeon with the appearance of any discoloration (white or blue). c. If you are given a sling to wear before you go home, please wear it  at all times until the block has completely worn off.  Do not get up at night without your sling. d. Please contact [facility] or your surgeon if you do not begin to regain sensation after 7 days from the surgery.  Anesthesia may be contacted by calling the Same Day Surgery Department, Mon. through Fri., 6 am to 4 pm at [phone].   e. If you experience any other problems or concerns, please contact your surgeon's office. f. If you experience severe or prolonged shortness of breath go to the nearest emergency department.   Diet: As you were doing prior to hospitalization   Shower:  May shower but keep the wounds dry, use an occlusive plastic wrap, NO SOAKING IN TUB.  If the bandage gets wet, change with a clean dry gauze.  Dressing:  You may change your dressing as needed. Change the dressing with sterile gauze dressing.    Activity:  Increase activity slowly as tolerated, but follow the weight bearing instructions below.  No lifting or driving for 6 weeks.  Weight Bearing:  Non-weightbearing to the right arm.  Blood Clot Prevention: Take 4  aspirin daily for the first 14 days.  To prevent constipation: you may use a stool softener such as -  Colace (over the counter) 100 mg by mouth twice a day  Drink plenty of fluids (prune juice may be helpful) and high fiber foods Miralax (over the counter) for constipation as needed.    Itching:  If you experience itching with your medications, try taking only a single pain pill, or even half a pain pill at a time.  You may take up to 10 pain pills per day, and you can also use benadryl over the counter  for itching or also to help with sleep.   Precautions:  If you experience chest pain or shortness of breath - call 911 immediately for transfer to the hospital emergency department!!  If you develop a fever greater that 101 F, purulent drainage from wound, increased redness or drainage from wound, or calf pain-Call [facility]                                              Follow- Up Appointment:  Please call for an appointment to be seen in 2 weeks at [facility]
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
## 2018-01-21 NOTE — Progress Notes (Signed)
Patient is being discharged home with family. IV removed with cath intact. Reviewed discharge instructions and sent extra dressing with patient. Allowed time for questions.

## 2018-01-21 NOTE — Evaluation (Signed)
Physical Therapy Evaluation Patient Details Name: Jason Hood MRN: [id number] DOB: April 18, 1946 Today's Date: 01/21/2018   History of Present Illness  Pt is a 72 y.o. male POD#1 s/p reverse R TSA 01/20/18.  PMHx includes GERD, arthritis, HTN, major depressive disorder, PTSD, sleep apnea, and L TSA approximately 3 years ago.  Clinical Impression  Prior to hospital admission, pt was independent.  Pt lives with his wife on main floor of home with 4 steps to enter with railings.  Currently pt is modified independent with bed mobility; independent with transfers; CGA progressing to SBA with ambulation no AD (pt initially unsteady but improved with distance ambulated); and SBA navigating 8 stairs with railing.  5/10 R shoulder pain during session (nursing notified).  Pt would benefit from skilled PT to address noted impairments and functional limitations (see below for any additional details).  Upon hospital discharge, recommend pt discharge to home with support of family and follow-up therapy per MD protocol post op surgery.    Follow Up Recommendations Follow surgeon's recommendation for DC plan and follow-up therapies    Equipment Recommendations  None recommended by PT    Recommendations for Other Services OT consult     Precautions / Restrictions Precautions Precautions: Shoulder Type of Shoulder Precautions: R UE NWBing Shoulder Interventions: Shoulder sling/immobilizer;Shoulder abduction pillow;Off for dressing/bathing/exercises;At all times Precaution Booklet Issued: Yes (comment) Restrictions Weight Bearing Restrictions: Yes RUE Weight Bearing: Non weight bearing      Mobility  Bed Mobility Overal bed mobility: Modified Independent             General bed mobility comments: Increased effort to perform on own but no physical assist required; bed flat  Transfers Overall transfer level: Independent Equipment used: None             General transfer comment: steady  strong transfers  Ambulation/Gait Ambulation/Gait assistance: Min guard;Supervision Ambulation Distance (Feet): (>500 feet) Assistive device: None   Gait velocity: mildly decreased initially but improved to normal gait velocity with increased distance ambulated   General Gait Details: pt initially mildly unsteady with altered stepping pattern and reporting feeling "shaky" but steadiness improved with distance ambulated  Stairs Stairs: Yes Stairs assistance: Supervision Stair Management: One rail Left;Alternating pattern;Forwards Number of Stairs: 8 General stair comments: mild increased time to perform (more cautious on steps for safety); steady and safe with technique  Wheelchair Mobility    Modified Rankin (Stroke Patients Only)       Balance Overall balance assessment: Needs assistance Sitting-balance support: No upper extremity supported;Feet supported Sitting balance-Leahy Scale: Normal Sitting balance - Comments: steady sitting reaching outside BOS with L UE   Standing balance support: No upper extremity supported Standing balance-Leahy Scale: Good Standing balance comment: steady standing reaching within BOS with L UE                             Pertinent Vitals/Pain Pain Assessment: 0-10 Pain Score: 5  Pain Location: Incision site Pain Descriptors / Indicators: Aching Pain Intervention(s): Limited activity within patient's tolerance;Monitored during session;Premedicated before session;Repositioned;Other (comment)(polar care applied)  Vitals (HR and O2 on room air) stable and WFL throughout treatment session.    Home Living Family/patient expects to be discharged to:: Private residence Living Arrangements: Spouse/significant other Available Help at Discharge: Family;Available 24 hours/day Type of Home: House Home Access: Stairs to enter Entrance Stairs-Rails: Left;Right(cannot reach both) Entrance Stairs-Number of Steps: 4 Home Layout: Two  level;Able to  live on main level with bedroom/bathroom Home Equipment: Shower seat - built in;Adaptive equipment;Hand held Careers information officer - 2 wheels;Cane - single point      Prior Function Level of Independence: Independent         Comments: Pt was independent with mobility, ADL, IADL, retired (built houses, 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 No falls.     Hand Dominance   Dominant Hand: Right    Extremity/Trunk Assessment   Upper Extremity Assessment Upper Extremity Assessment: Defer to OT evaluation RUE Deficits / Details: per OT note "interscalane block limiting pt's ability to perform AROM elbow, ROM hand WFL; impaired sensation and proprioception 2:2 nerve block" RUE: Unable to fully assess due to pain;Unable to fully assess due to immobilization(per OT note) RUE Sensation: decreased proprioception;decreased light touch(per OT note) RUE Coordination: decreased gross motor(per OT note)    Lower Extremity Assessment Lower Extremity Assessment: Overall WFL for tasks assessed    Cervical / Trunk Assessment Cervical / Trunk Assessment: Normal  Communication   Communication: No difficulties  Cognition Arousal/Alertness: Awake/alert Behavior During Therapy: WFL for tasks assessed/performed Overall Cognitive Status: Within Functional Limits for tasks assessed                                        General Comments General comments (skin integrity, edema, etc.): Polar care and sling/immobilizer in place upon PT arrival (OT had already seen pt and appropriately fitted pt).  Pt agreeable to PT session.    Exercises    Assessment/Plan    PT Assessment Patient needs continued PT services  PT Problem List Decreased strength;Decreased range of motion;Decreased balance;Decreased mobility;Decreased knowledge of precautions;Pain       PT Treatment  Interventions Gait training;Stair training;Functional mobility training;Therapeutic activities;Therapeutic exercise;Balance training;Patient/family education    PT Goals (Current goals can be found in the Care Plan section)  Acute Rehab PT Goals Patient Stated Goal: go home PT Goal Formulation: With patient Time For Goal Achievement: 02/04/18 Potential to Achieve Goals: Good    Frequency 7X/week   Barriers to discharge        Co-evaluation               AM-PAC PT "6 Clicks" Daily Activity  Outcome Measure Difficulty turning over in bed (including adjusting bedclothes, sheets and blankets)?: A Little Difficulty moving from lying on back to sitting on the side of the bed? : A Lot Difficulty sitting down on and standing up from a chair with arms (e.g., wheelchair, bedside commode, etc,.)?: None Help needed moving to and from a bed to chair (including a wheelchair)?: None Help needed walking in hospital room?: None Help needed climbing 3-5 steps with a railing? : A Little 6 Click Score: 20    End of Session Equipment Utilized During Treatment: Gait belt;Other (comment)(R shoulder sling/immobilizer) Activity Tolerance: Patient tolerated treatment well Patient left: in chair;with call bell/phone within reach;with chair alarm set Nurse Communication: Mobility status;Precautions;Weight bearing status PT Visit Diagnosis: Other abnormalities of gait and mobility (R26.89);Pain Pain - Right/Left: Right Pain - part of body: Shoulder    Time: 6644-0347 PT Time Calculation (min) (ACUTE ONLY): 16 min   Charges:   PT Evaluation $PT Eval Low Complexity: 1 Low     PT G Codes:  Hendricks Limes, PT 01/21/18, 10:43 AM [phone]

## 2018-01-22 LAB — SURGICAL PATHOLOGY

## 2018-01-27 ENCOUNTER — Telehealth: Payer: Self-pay

## 2018-01-27 NOTE — Telephone Encounter (Signed)
EMMI Follow-up: It was noted on the report that patient had some unfilled Rxs and other questions. I called Jason Hood and he had not gotten his Rxs filled yet but plans to.  He said he had no other concerns at this.

## 2018-01-28 ENCOUNTER — Telehealth: Payer: Self-pay | Admitting: Licensed Clinical Social Worker

## 2018-01-28 NOTE — Telephone Encounter (Signed)
EMMI flagged patient for answering yes to loss of interest in things and yes to feeling sad/hopless/anxious/empty. Clinical Child psychotherapist (CSW) contacted patient and a woman answered the phone and said he was at the post office and will be back. She took down CSW's information and said she would have the patient call CSW back.   [employer], LCSW [phone]

## 2018-01-28 NOTE — Telephone Encounter (Signed)
Clinical Child psychotherapist (CSW) received a call back from patient. Patient reported that he is doing good and will start PT in 6 weeks. Patient reported that he has not filled his prescriptions because he has pain medicine at home already. Patient reported that he is not feeling sad or depressed. Patient reported that he did not mean to answer yes to feeling sad or loss of interest in things. Patient reported no needs or concerns.   [employer], LCSW [phone]

## 2018-08-03 IMAGING — CT CT SHOULDER*R* W/O CM
2 of 3 series · 10 of 14 positions shown, 11 images · non-contrast
Comparison: MRI right shoulder this same day.

CLINICAL DATA: Right shoulder pain and limited range of motion for
5-6 years. No known injury.

EXAM:
CT OF THE UPPER RIGHT EXTREMITY WITHOUT CONTRAST
TECHNIQUE: Multidetector CT imaging of the upper right extremity was performed
according to the standard protocol.

[Series 3: thin bone · axial · 0.47mm/px · z∈[-123,+7]mm · 7 of 348 slices shown]
[im 44/348  bone]
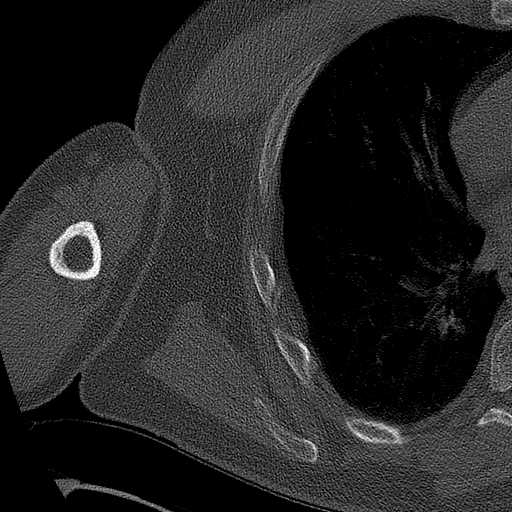
[im 87/348  bone]
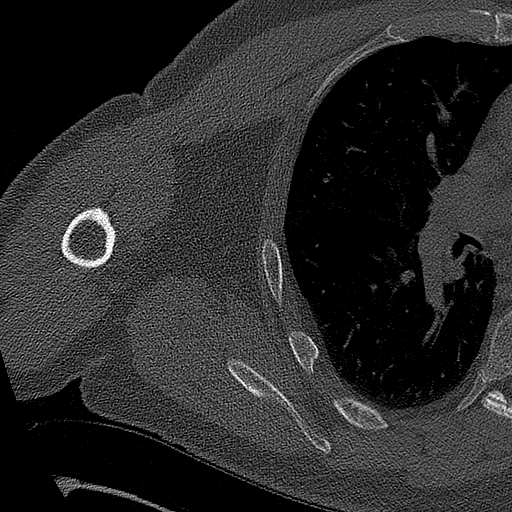
[im 131/348  bone]
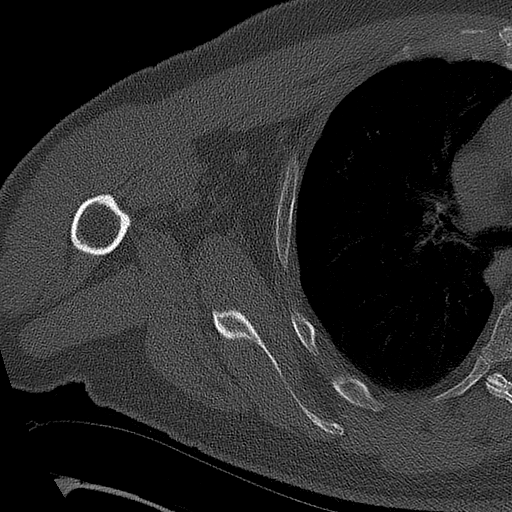
[im 174/348  bone]
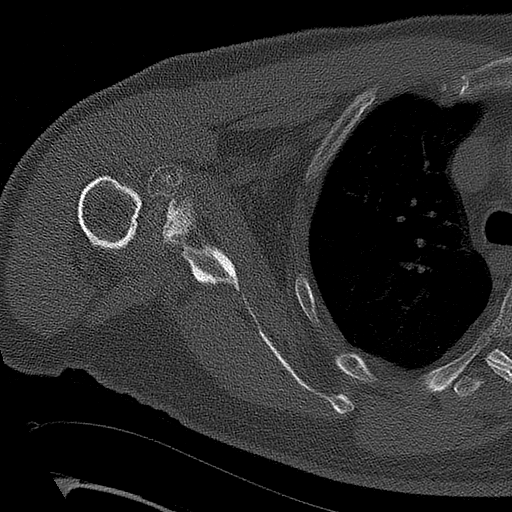
[im 217/348  bone]
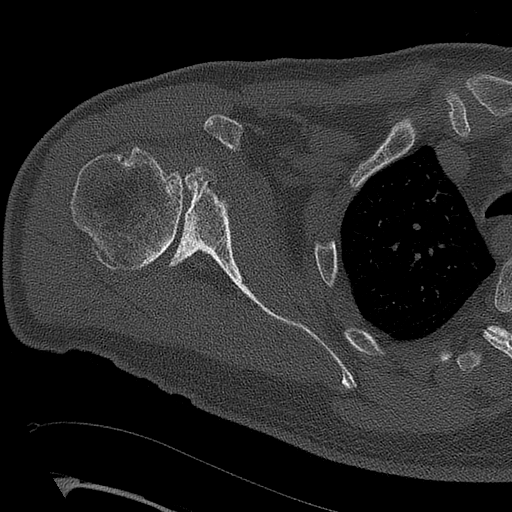
[im 261/348  bone]
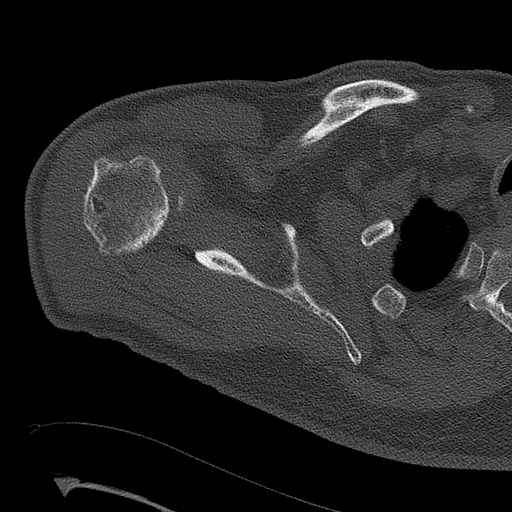
[im 304/348  bone]
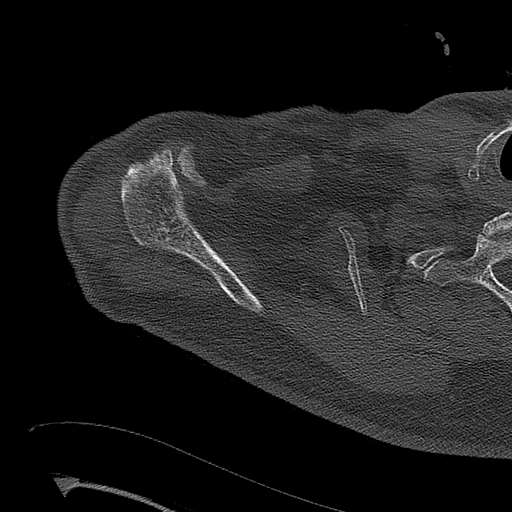

[Series 7: ax st · axial · 0.43mm/px · z∈[-184,+4]mm · 3 of 99 slices shown, 4 images]
[im 1/99  soft-tissue]
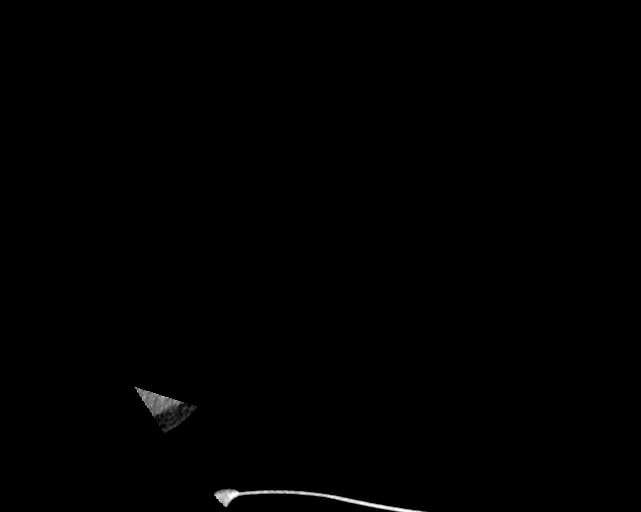
[im 1/99  bone]
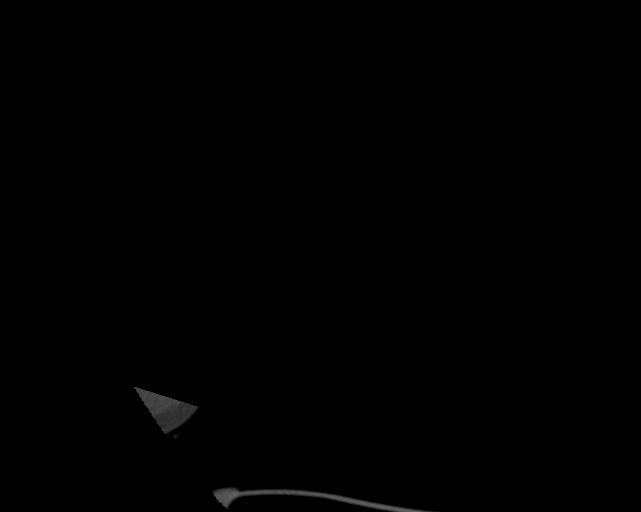
[im 50/99  bone]
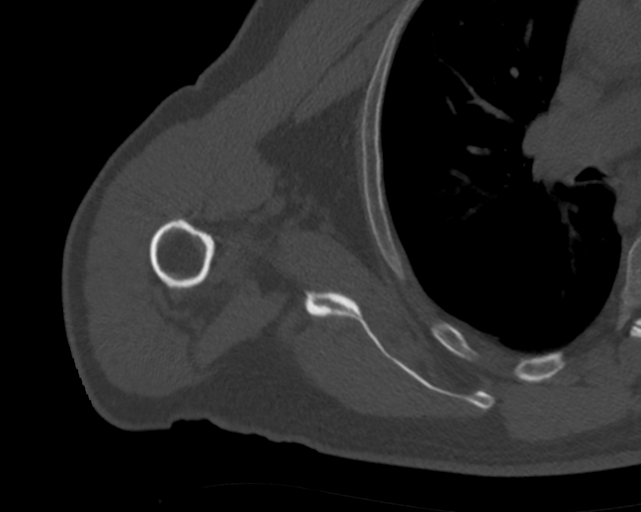
[im 99/99  bone]
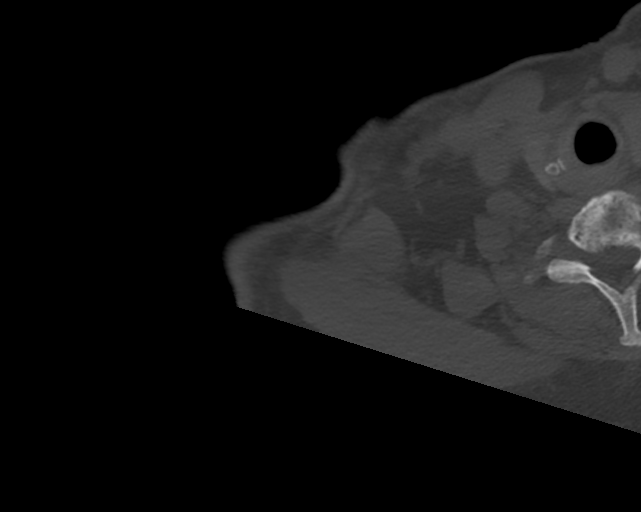

[10 of 14 positions shown; findings below may reference images not displayed]

FINDINGS: Bones/Joint/Cartilage

The patient has severe glenohumeral osteoarthritis. The glenoid is
flattened and remodeled with subchondral sclerosis. A few tiny
subchondral cysts are seen in the inferior glenoid. A large
osteophyte is seen off the humeral head. Loose body in the anterior
aspect of the joint measures 1.8 cm AP x 1.5 cm transverse x 2.0 cm
craniocaudal.

Mild to moderate acromioclavicular osteoarthritis is noted. The
acromion is type 1 with a very small spur off the anterior acromion.
No fracture or focal lesion.

Ligaments

Suboptimally assessed by CT.

Muscles and Tendons

Appear intact.  No abnormality identified.

Soft tissues

Imaged lung parenchyma is clear.
IMPRESSION: Dominant finding is advanced glenohumeral osteoarthritis with a 2 cm
in diameter loose body in the anterior aspect of the joint.

Mild to moderate acromioclavicular osteoarthritis.

## 2018-09-04 IMAGING — DX DG SHOULDER 2+V PORT*R*
2 series · 2 of 2 positions shown · non-contrast
Comparison: MRI of the right shoulder 12/19/2017

CLINICAL DATA: Postop

EXAM:
PORTABLE RIGHT SHOULDER

[shoulder ap]
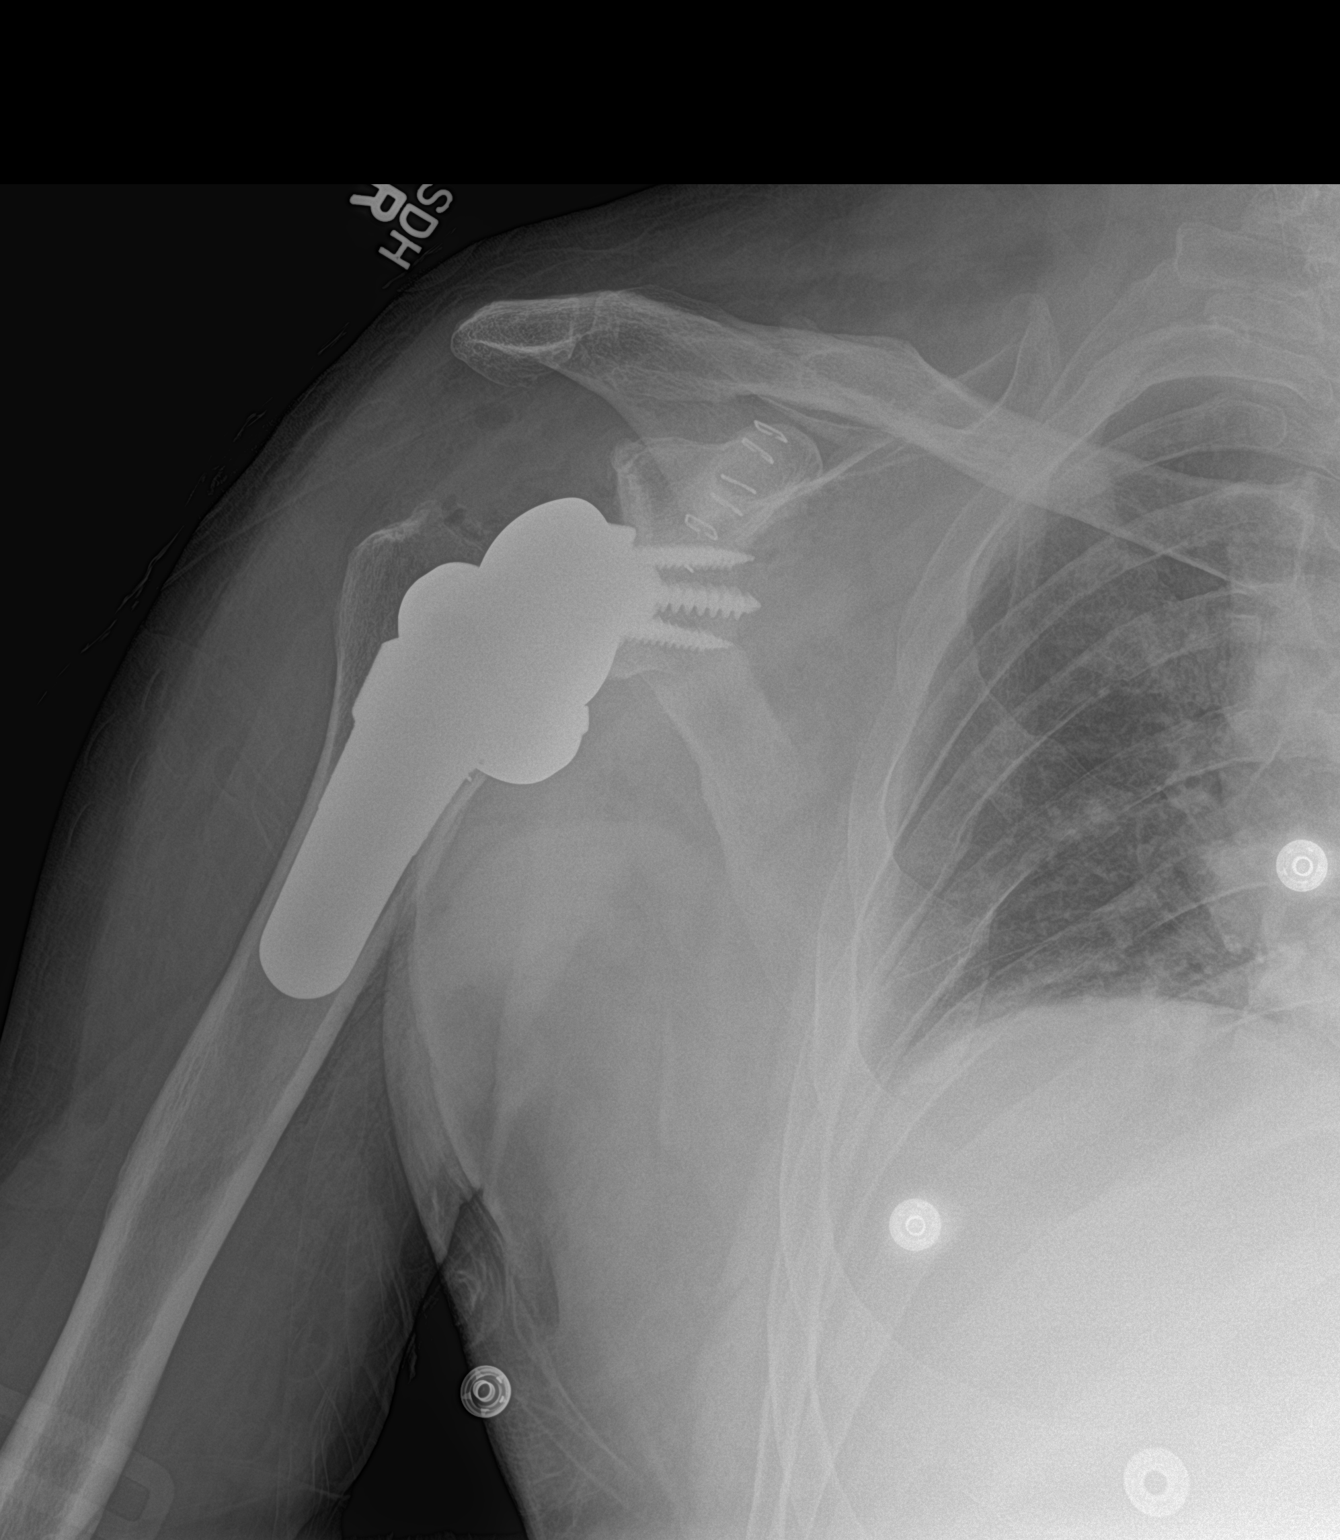

[shoulder obl]
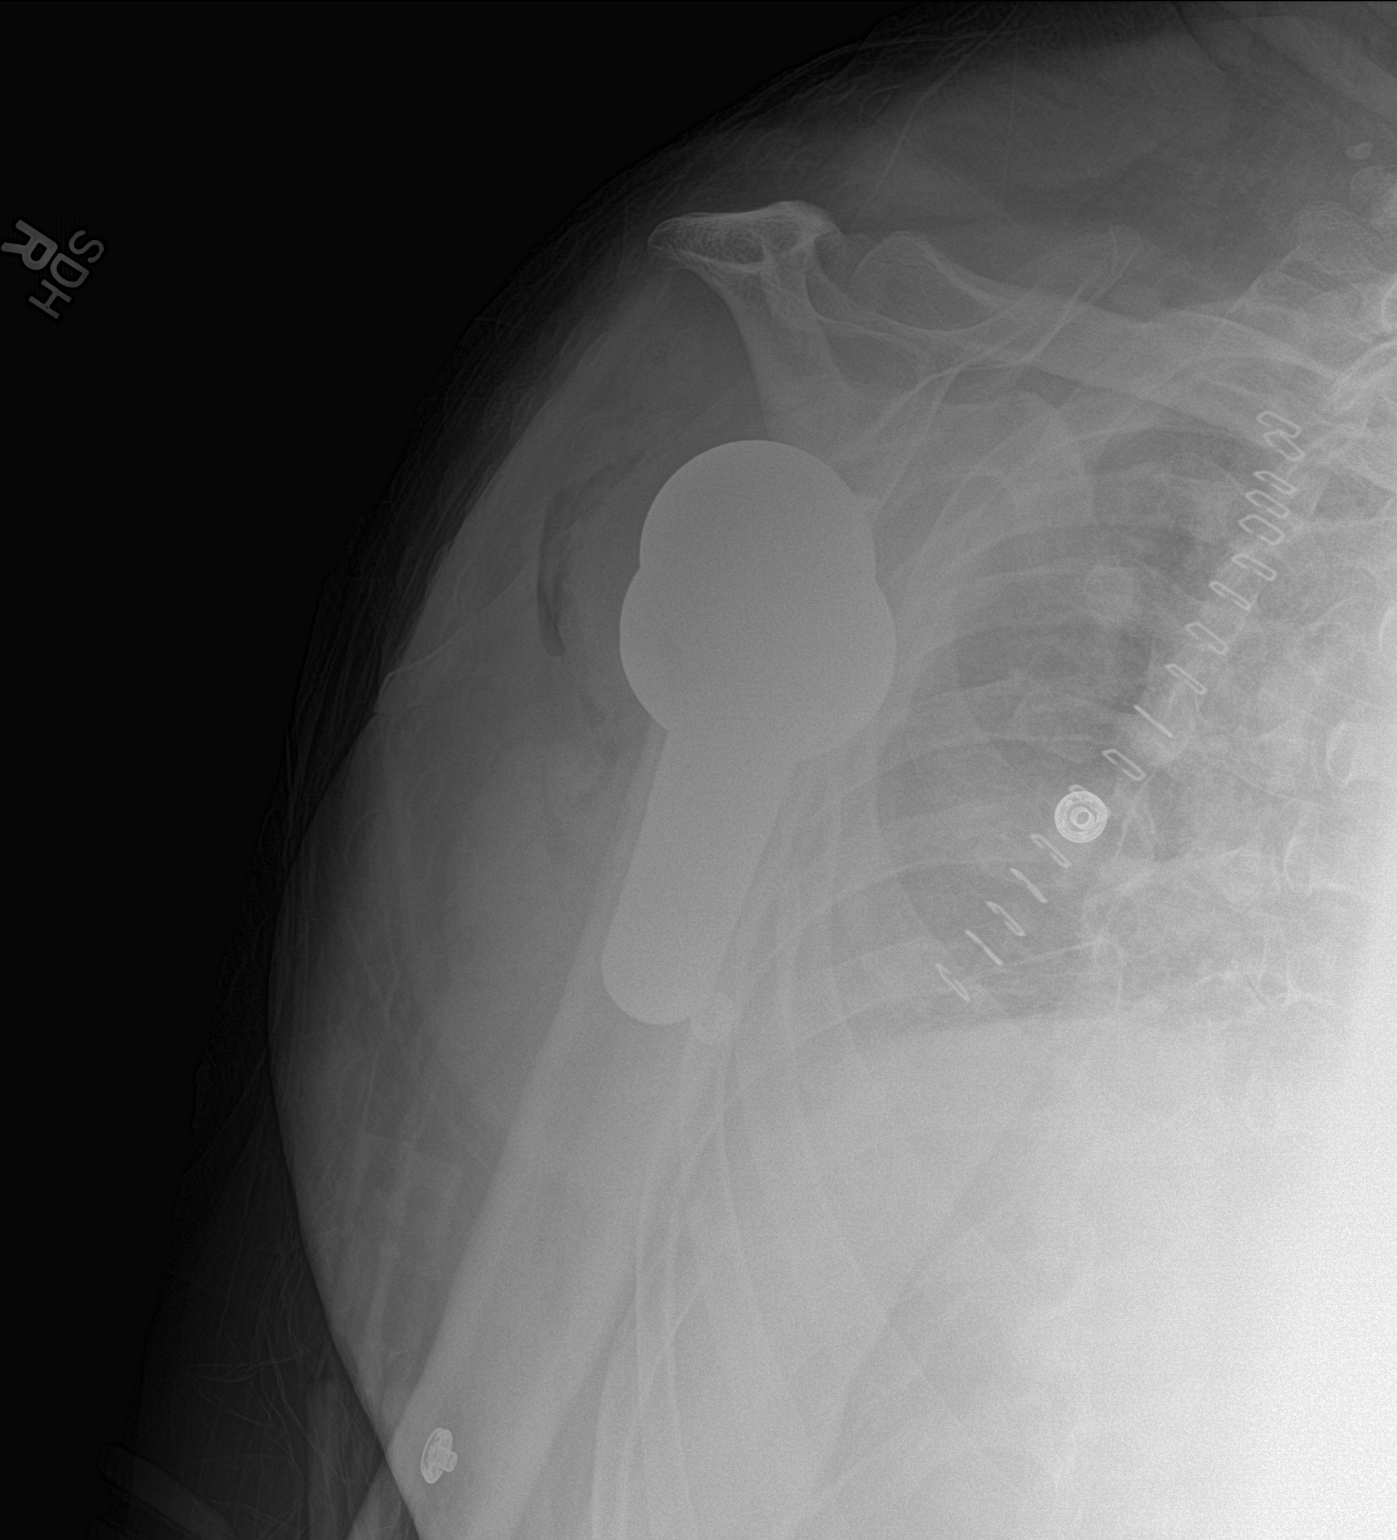

[2 of 2 positions shown; findings below may reference images not displayed]

FINDINGS: New reverse glenohumeral arthroplasty that is located. No evidence
of periprosthetic fracture.
IMPRESSION: No adverse finding after reverse glenohumeral arthroplasty.

## 2019-10-18 ENCOUNTER — Ambulatory Visit: Payer: No Typology Code available for payment source
